# Patient Record
Sex: Female | Born: 1965 | Race: White | Hispanic: No | Marital: Married | Smoking: Never smoker
Health system: Southern US, Community
[De-identification: ages and names within clinical notes are randomized; demographics above are authoritative.]

## PROBLEM LIST (undated history)

## (undated) DIAGNOSIS — E039 Hypothyroidism, unspecified: Secondary | ICD-10-CM

## (undated) DIAGNOSIS — M7580 Other shoulder lesions, unspecified shoulder: Secondary | ICD-10-CM

## (undated) DIAGNOSIS — J302 Other seasonal allergic rhinitis: Secondary | ICD-10-CM

## (undated) DIAGNOSIS — M778 Other enthesopathies, not elsewhere classified: Secondary | ICD-10-CM

## (undated) HISTORY — PX: KNEE ARTHROSCOPY: SHX127

## (undated) HISTORY — DX: Hypothyroidism, unspecified: E03.9

## (undated) HISTORY — PX: APPENDECTOMY: SHX54

## (undated) HISTORY — DX: Other shoulder lesions, unspecified shoulder: M75.80

## (undated) HISTORY — DX: Other enthesopathies, not elsewhere classified: M77.8

---

## 1998-11-20 ENCOUNTER — Inpatient Hospital Stay (HOSPITAL_COMMUNITY): Admission: AD | Admit: 1998-11-20 | Discharge: 1998-11-20 | Payer: Self-pay | Admitting: Gynecology

## 1998-11-20 ENCOUNTER — Other Ambulatory Visit: Admission: RE | Admit: 1998-11-20 | Discharge: 1998-11-20 | Payer: Self-pay | Admitting: Gynecology

## 1999-01-13 ENCOUNTER — Inpatient Hospital Stay (HOSPITAL_COMMUNITY): Admission: AD | Admit: 1999-01-13 | Discharge: 1999-01-13 | Payer: Self-pay | Admitting: Gynecology

## 1999-03-13 ENCOUNTER — Other Ambulatory Visit: Admission: RE | Admit: 1999-03-13 | Discharge: 1999-03-13 | Payer: Self-pay | Admitting: Gynecology

## 1999-12-04 ENCOUNTER — Other Ambulatory Visit: Admission: RE | Admit: 1999-12-04 | Discharge: 1999-12-04 | Payer: Self-pay | Admitting: Gynecology

## 2000-04-30 ENCOUNTER — Encounter: Payer: Self-pay | Admitting: Obstetrics and Gynecology

## 2000-04-30 ENCOUNTER — Ambulatory Visit (HOSPITAL_COMMUNITY): Admission: RE | Admit: 2000-04-30 | Discharge: 2000-04-30 | Payer: Self-pay | Admitting: Obstetrics and Gynecology

## 2000-07-13 ENCOUNTER — Encounter: Payer: Self-pay | Admitting: Obstetrics and Gynecology

## 2000-07-13 ENCOUNTER — Ambulatory Visit (HOSPITAL_COMMUNITY): Admission: RE | Admit: 2000-07-13 | Discharge: 2000-07-13 | Payer: Self-pay | Admitting: Obstetrics and Gynecology

## 2000-08-12 ENCOUNTER — Encounter: Payer: Self-pay | Admitting: Obstetrics and Gynecology

## 2000-08-12 ENCOUNTER — Ambulatory Visit (HOSPITAL_COMMUNITY): Admission: RE | Admit: 2000-08-12 | Discharge: 2000-08-12 | Payer: Self-pay | Admitting: Obstetrics and Gynecology

## 2000-08-14 ENCOUNTER — Encounter: Payer: Self-pay | Admitting: Obstetrics and Gynecology

## 2000-08-14 ENCOUNTER — Encounter (HOSPITAL_COMMUNITY): Admission: RE | Admit: 2000-08-14 | Discharge: 2000-09-12 | Payer: Self-pay | Admitting: Obstetrics and Gynecology

## 2000-08-25 ENCOUNTER — Encounter: Payer: Self-pay | Admitting: Obstetrics and Gynecology

## 2000-09-01 ENCOUNTER — Encounter: Payer: Self-pay | Admitting: Obstetrics and Gynecology

## 2000-09-01 ENCOUNTER — Ambulatory Visit (HOSPITAL_COMMUNITY): Admission: RE | Admit: 2000-09-01 | Discharge: 2000-09-01 | Payer: Self-pay | Admitting: Obstetrics and Gynecology

## 2000-09-08 ENCOUNTER — Encounter: Payer: Self-pay | Admitting: Obstetrics and Gynecology

## 2000-09-12 ENCOUNTER — Inpatient Hospital Stay (HOSPITAL_COMMUNITY): Admission: AD | Admit: 2000-09-12 | Discharge: 2000-09-15 | Payer: Self-pay | Admitting: Obstetrics and Gynecology

## 2000-09-16 ENCOUNTER — Encounter: Admission: RE | Admit: 2000-09-16 | Discharge: 2000-10-16 | Payer: Self-pay | Admitting: Obstetrics and Gynecology

## 2000-10-17 ENCOUNTER — Encounter: Admission: RE | Admit: 2000-10-17 | Discharge: 2000-11-16 | Payer: Self-pay | Admitting: Obstetrics and Gynecology

## 2001-03-15 ENCOUNTER — Other Ambulatory Visit: Admission: RE | Admit: 2001-03-15 | Discharge: 2001-03-15 | Payer: Self-pay | Admitting: Obstetrics and Gynecology

## 2002-04-13 ENCOUNTER — Other Ambulatory Visit: Admission: RE | Admit: 2002-04-13 | Discharge: 2002-04-13 | Payer: Self-pay | Admitting: Obstetrics and Gynecology

## 2003-05-03 ENCOUNTER — Other Ambulatory Visit: Admission: RE | Admit: 2003-05-03 | Discharge: 2003-05-03 | Payer: Self-pay | Admitting: Obstetrics and Gynecology

## 2004-05-06 ENCOUNTER — Other Ambulatory Visit: Admission: RE | Admit: 2004-05-06 | Discharge: 2004-05-06 | Payer: Self-pay | Admitting: Obstetrics and Gynecology

## 2010-10-21 ENCOUNTER — Other Ambulatory Visit: Payer: Self-pay | Admitting: Obstetrics and Gynecology

## 2010-10-21 DIAGNOSIS — Z1231 Encounter for screening mammogram for malignant neoplasm of breast: Secondary | ICD-10-CM

## 2010-10-25 ENCOUNTER — Ambulatory Visit: Payer: Self-pay

## 2010-10-30 ENCOUNTER — Ambulatory Visit: Payer: Self-pay

## 2011-01-07 ENCOUNTER — Ambulatory Visit
Admission: RE | Admit: 2011-01-07 | Discharge: 2011-01-07 | Disposition: A | Discharge status: Home / Self Care | Payer: BC Managed Care – PPO | Source: Ambulatory Visit | Attending: Obstetrics and Gynecology | Admitting: Obstetrics and Gynecology

## 2011-01-07 DIAGNOSIS — Z1231 Encounter for screening mammogram for malignant neoplasm of breast: Secondary | ICD-10-CM

## 2012-02-27 ENCOUNTER — Other Ambulatory Visit: Payer: Self-pay | Admitting: Obstetrics and Gynecology

## 2012-02-27 DIAGNOSIS — Z1231 Encounter for screening mammogram for malignant neoplasm of breast: Secondary | ICD-10-CM

## 2012-03-25 ENCOUNTER — Ambulatory Visit: Payer: BC Managed Care – PPO

## 2012-03-26 ENCOUNTER — Ambulatory Visit
Admission: RE | Admit: 2012-03-26 | Discharge: 2012-03-26 | Disposition: A | Discharge status: Home / Self Care | Payer: BC Managed Care – PPO | Source: Ambulatory Visit | Attending: Obstetrics and Gynecology | Admitting: Obstetrics and Gynecology

## 2012-03-26 DIAGNOSIS — Z1231 Encounter for screening mammogram for malignant neoplasm of breast: Secondary | ICD-10-CM

## 2012-03-29 ENCOUNTER — Other Ambulatory Visit: Payer: Self-pay | Admitting: Obstetrics and Gynecology

## 2012-03-29 DIAGNOSIS — R928 Other abnormal and inconclusive findings on diagnostic imaging of breast: Secondary | ICD-10-CM

## 2012-04-09 ENCOUNTER — Other Ambulatory Visit: Payer: Self-pay | Admitting: Obstetrics and Gynecology

## 2012-04-09 ENCOUNTER — Ambulatory Visit
Admission: RE | Admit: 2012-04-09 | Discharge: 2012-04-09 | Disposition: A | Discharge status: Home / Self Care | Payer: BC Managed Care – PPO | Source: Ambulatory Visit | Attending: Obstetrics and Gynecology | Admitting: Obstetrics and Gynecology

## 2012-04-09 DIAGNOSIS — R928 Other abnormal and inconclusive findings on diagnostic imaging of breast: Secondary | ICD-10-CM

## 2012-04-09 IMAGING — MG MM DIGITAL DIAGNOSTIC LIMITED*L*
1 series · 1 of 1 positions shown · non-contrast
Comparison: Prior studies

CLINICAL DATA: Screening callback for questioned left breast mass

DIGITAL DIAGNOSTIC LEFT MAMMOGRAM WITH CAD

[L LM]
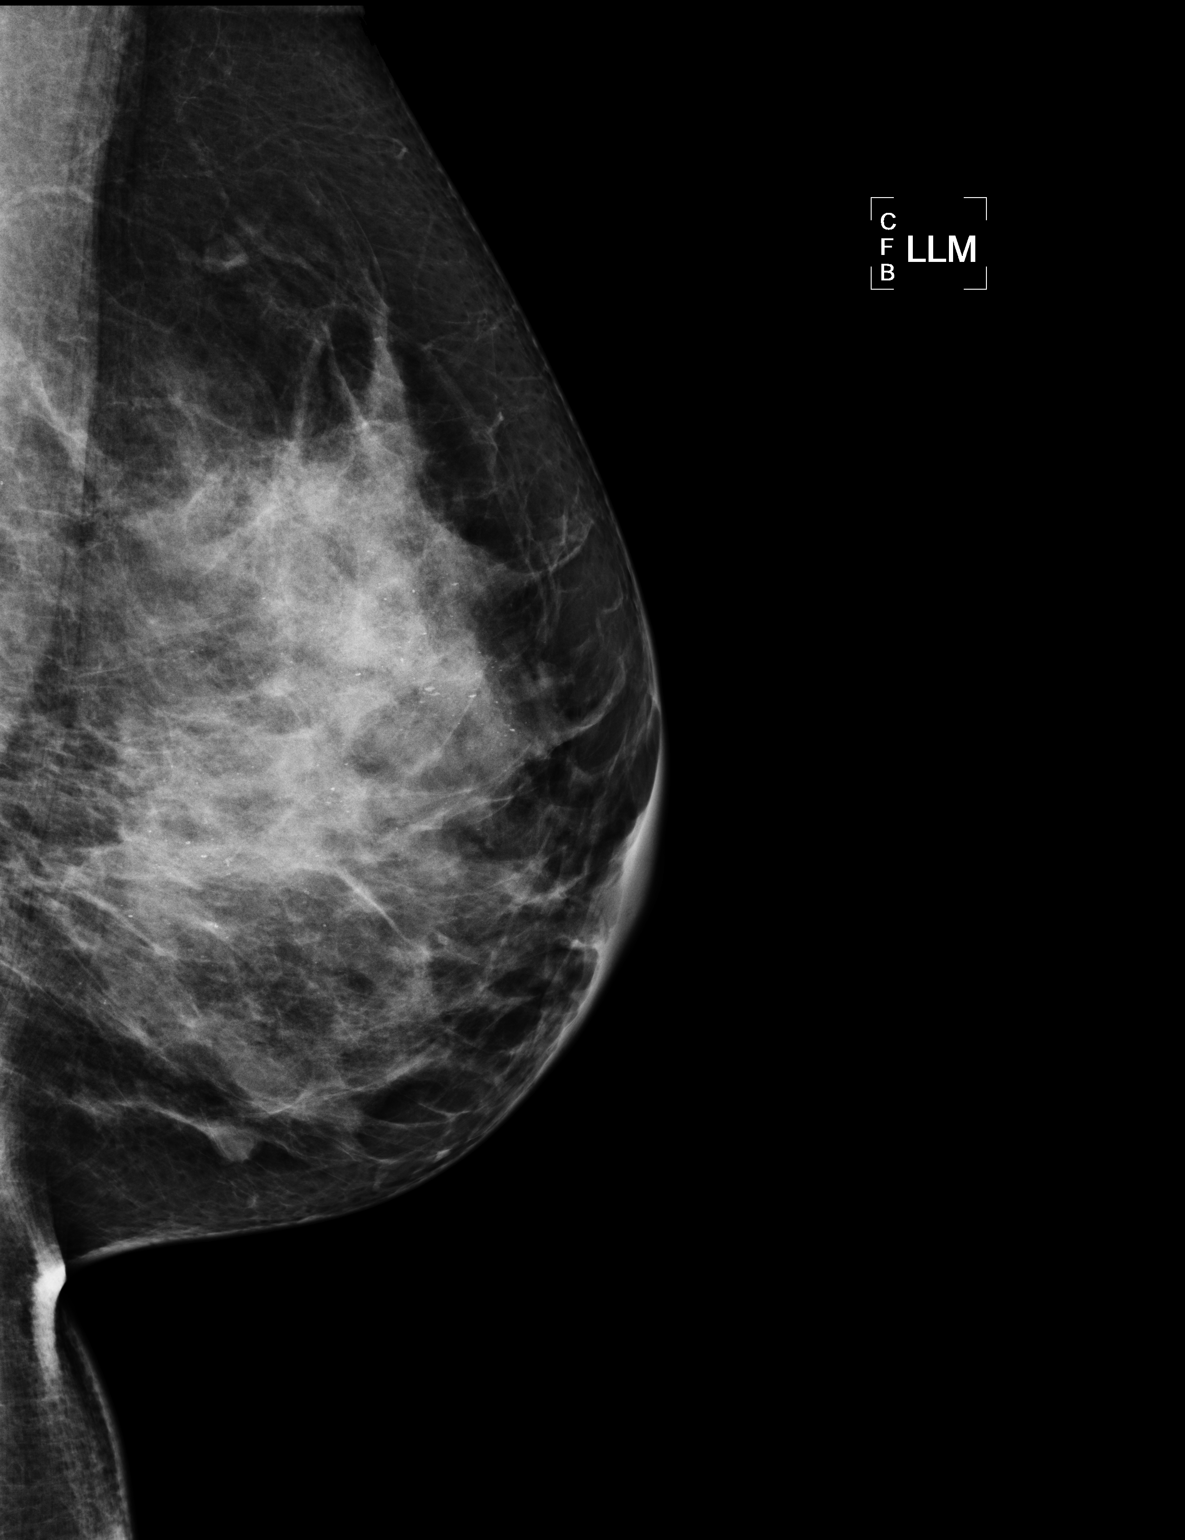

[1 of 1 positions shown; findings below may reference images not displayed]

FINDINGS: The area in question in the left breast is not
reproduced on additional imaging.  The area changes conformation
and demonstrates pliability, compatible with normal-appearing
fibroglandular tissue. No worrisome finding is seen.

ACR breast density category: 4: The breast tissue is extremely
dense.

Mammographic images were processed with CAD.
IMPRESSION: No evidence for malignancy in the left breast. Screening
mammography is recommended in one year. Findings and
recommendations discussed with the patient and provided in written
form at the time of the exam.

BI-RADS CATEGORY 1:  Negative.

## 2013-03-28 ENCOUNTER — Other Ambulatory Visit: Payer: Self-pay

## 2013-03-28 DIAGNOSIS — Z1231 Encounter for screening mammogram for malignant neoplasm of breast: Secondary | ICD-10-CM

## 2013-04-20 ENCOUNTER — Ambulatory Visit: Payer: BC Managed Care – PPO

## 2013-04-22 ENCOUNTER — Ambulatory Visit
Admission: RE | Admit: 2013-04-22 | Discharge: 2013-04-22 | Disposition: A | Discharge status: Home / Self Care | Payer: BC Managed Care – PPO | Source: Ambulatory Visit

## 2013-04-22 DIAGNOSIS — Z1231 Encounter for screening mammogram for malignant neoplasm of breast: Secondary | ICD-10-CM

## 2013-07-20 ENCOUNTER — Ambulatory Visit (INDEPENDENT_AMBULATORY_CARE_PROVIDER_SITE_OTHER): Payer: BC Managed Care – PPO | Admitting: Podiatry

## 2013-07-20 ENCOUNTER — Ambulatory Visit (INDEPENDENT_AMBULATORY_CARE_PROVIDER_SITE_OTHER): Payer: BC Managed Care – PPO

## 2013-07-20 ENCOUNTER — Encounter: Payer: Self-pay | Admitting: Podiatry

## 2013-07-20 VITALS — BP 108/69 | HR 63 | Resp 16 | Ht 64.0 in | Wt 130.0 lb

## 2013-07-20 DIAGNOSIS — M722 Plantar fascial fibromatosis: Secondary | ICD-10-CM

## 2013-07-20 MED ORDER — DICLOFENAC SODIUM 75 MG PO TBEC: 75.0000 mg | DELAYED_RELEASE_TABLET | Freq: Two times a day (BID) | ORAL | Status: DC

## 2013-07-20 MED ORDER — TRIAMCINOLONE ACETONIDE 10 MG/ML IJ SUSP
10.0000 mg | Freq: Once | INTRAMUSCULAR | Status: AC
  Administered 2013-07-20: 10 mg

## 2013-07-20 NOTE — Progress Notes (Signed)
   Subjective:    Patient ID: Leah Stevenson, female    DOB: 09/21/1965, 48 y.o.   MRN: [id number]  HPI Comments: "I have plantar fasciitis, I think"  Patient c/o aching plantar heel and arch left for 2 years. Worsened recently. She does have AM pain. She has tried massaging and Biofreeze-which helps temp.  Foot Pain      Review of Systems  Musculoskeletal: Positive for gait problem.  All other systems reviewed and are negative.      Objective:   Physical Exam        Assessment & Plan:

## 2013-07-20 NOTE — Progress Notes (Signed)
Subjective:     Patient ID: Leah ItoSylvia L Stevenson, female   DOB: 12/01/1965, 48 y.o.   MRN: [id number]  Foot Pain   patient presents with long-term pain in the left plantar heel and admits that it's gotten worse over the last 6 months has not responded to ice therapy and supportive therapy  Review of Systems  All other systems reviewed and are negative.      Objective:   Physical Exam  Nursing note and vitals reviewed. Constitutional: She is oriented to person, place, and time.  Cardiovascular: Intact distal pulses.   Musculoskeletal: Normal range of motion.  Neurological: She is oriented to person, place, and time.  Skin: Skin is warm.   neurovascular status intact with muscle strength found to be adequate in range of motion of the subtalar and midtarsal joint within normal limits. Patient is found to have exquisite discomfort at the left plantar heel insertion of the tendon into the calcaneus and did have good digital perfusion to the toes and I also noted some depression of the arch on weightbearing     Assessment:     Plantar fasciitis of the long-term nature with acute issues left heel with mechanical dysfunction    Plan:     H&P and x-rays reviewed today I injected the plantar fascia 3 mg Kenalog 5 mg Xylocaine Marcaine mixture dispensed fascially brace with instructions for usage and discussed long-term orthotics that we will consider at next visit. Dispensed sheets concerning condition and physical therapy and placed on Voltaren 75 mg twice a day

## 2013-07-20 NOTE — Patient Instructions (Signed)

## 2013-07-28 ENCOUNTER — Ambulatory Visit (INDEPENDENT_AMBULATORY_CARE_PROVIDER_SITE_OTHER): Payer: BC Managed Care – PPO | Admitting: Podiatry

## 2013-07-28 ENCOUNTER — Encounter: Payer: Self-pay | Admitting: Podiatry

## 2013-07-28 VITALS — BP 117/73 | HR 64 | Resp 12 | Ht 64.0 in | Wt 135.0 lb

## 2013-07-28 DIAGNOSIS — M722 Plantar fascial fibromatosis: Secondary | ICD-10-CM

## 2013-07-28 NOTE — Progress Notes (Signed)
Subjective:     Patient ID: Leah Stevenson, female   DOB: Mar 16, 1965, 48 y.o.   MRN: [id number]  HPI patient states my left heel is improving but still sore if I can on it all day   Review of Systems     Objective:   Physical Exam Neurovascular status intact with significant reduction of inflammation and pain left plantar heel    Assessment:     Improved plantar fasciitis left heel    Plan:     Advised on physical therapy supportive shoes in scanned for custom orthotics to reduce all stress against heel and arch

## 2013-08-19 ENCOUNTER — Ambulatory Visit (INDEPENDENT_AMBULATORY_CARE_PROVIDER_SITE_OTHER): Payer: BC Managed Care – PPO | Admitting: *Deleted

## 2013-08-19 DIAGNOSIS — M722 Plantar fascial fibromatosis: Secondary | ICD-10-CM

## 2013-08-19 NOTE — Patient Instructions (Signed)

## 2013-08-19 NOTE — Progress Notes (Signed)
   Subjective:    Patient ID: Leah ItoSylvia L Stief, female    DOB: 10/29/1965, 48 y.o.   MRN: [id number]  HPI PICK UP ORTHOTICS AND GIVEN INSTRUCTION.   Review of Systems     Objective:   Physical Exam        Assessment & Plan:

## 2015-07-07 ENCOUNTER — Encounter (HOSPITAL_COMMUNITY): Payer: Self-pay

## 2015-07-07 ENCOUNTER — Emergency Department (HOSPITAL_COMMUNITY)
Admission: EM | Admit: 2015-07-07 | Discharge: 2015-07-07 | Disposition: A | Discharge status: Home / Self Care | Payer: BLUE CROSS/BLUE SHIELD | Attending: Emergency Medicine | Admitting: Emergency Medicine

## 2015-07-07 ENCOUNTER — Encounter: Payer: Self-pay | Admitting: Cardiovascular Disease

## 2015-07-07 ENCOUNTER — Emergency Department (HOSPITAL_COMMUNITY): Payer: BLUE CROSS/BLUE SHIELD

## 2015-07-07 DIAGNOSIS — Z791 Long term (current) use of non-steroidal anti-inflammatories (NSAID): Secondary | ICD-10-CM | POA: Diagnosis not present

## 2015-07-07 DIAGNOSIS — R008 Other abnormalities of heart beat: Secondary | ICD-10-CM | POA: Diagnosis present

## 2015-07-07 DIAGNOSIS — J159 Unspecified bacterial pneumonia: Secondary | ICD-10-CM | POA: Insufficient documentation

## 2015-07-07 DIAGNOSIS — J189 Pneumonia, unspecified organism: Secondary | ICD-10-CM

## 2015-07-07 DIAGNOSIS — I471 Supraventricular tachycardia: Secondary | ICD-10-CM | POA: Diagnosis not present

## 2015-07-07 HISTORY — DX: Other seasonal allergic rhinitis: J30.2

## 2015-07-07 LAB — BASIC METABOLIC PANEL
Anion gap: 10 (ref 5–15)
BUN: 14 mg/dL (ref 6–20)
CHLORIDE: 108 mmol/L (ref 101–111)
CO2: 22 mmol/L (ref 22–32)
CREATININE: 1.09 mg/dL — AB (ref 0.44–1.00)
Calcium: 8.5 mg/dL — ABNORMAL LOW (ref 8.9–10.3)
GFR calc Af Amer: >60 mL/min (ref >60)
GFR calc non Af Amer: 59 mL/min — ABNORMAL LOW (ref >60)
Glucose, Bld: 117 mg/dL — ABNORMAL HIGH (ref 65–99)
Potassium: 3.8 mmol/L (ref 3.5–5.1)
SODIUM: 140 mmol/L (ref 135–145)

## 2015-07-07 LAB — I-STAT TROPONIN, ED: TROPONIN I, POC: 0.17 ng/mL — AB (ref 0.00–0.08)

## 2015-07-07 LAB — CBC WITH DIFFERENTIAL/PLATELET
BASOS ABS: 0 10*3/uL (ref 0.0–0.1)
BASOS PCT: 0 %
Eosinophils Absolute: 0 10*3/uL (ref 0.0–0.7)
Eosinophils Relative: 0 %
HEMATOCRIT: 39.4 % (ref 36.0–46.0)
HEMOGLOBIN: 13.2 g/dL (ref 12.0–15.0)
LYMPHS PCT: 5 %
Lymphs Abs: 1 10*3/uL (ref 0.7–4.0)
MCH: 30.8 pg (ref 26.0–34.0)
MCHC: 33.5 g/dL (ref 30.0–36.0)
MCV: 91.8 fL (ref 78.0–100.0)
MONO ABS: 1 10*3/uL (ref 0.1–1.0)
Monocytes Relative: 5 %
NEUTROS ABS: 20.2 10*3/uL — AB (ref 1.7–7.7)
NEUTROS PCT: 90 %
Platelets: 211 10*3/uL (ref 150–400)
RBC: 4.29 MIL/uL (ref 3.87–5.11)
RDW: 12.2 % (ref 11.5–15.5)
WBC: 22.2 10*3/uL — AB (ref 4.0–10.5)

## 2015-07-07 MED ORDER — AMOXICILLIN-POT CLAVULANATE 875-125 MG PO TABS: 1.0000 | ORAL_TABLET | Freq: Two times a day (BID) | ORAL | Status: DC

## 2015-07-07 MED ORDER — DEXTROSE 5 % IV SOLN
1.0000 g | Freq: Once | INTRAVENOUS | Status: AC
  Administered 2015-07-07: 1 g via INTRAVENOUS
  Filled 2015-07-07: qty 10

## 2015-07-07 MED ORDER — ASPIRIN 81 MG PO CHEW
324.0000 mg | CHEWABLE_TABLET | Freq: Once | ORAL | Status: DC
  Filled 2015-07-07: qty 4

## 2015-07-07 MED ORDER — DEXTROSE 5 % IV SOLN
500.0000 mg | Freq: Once | INTRAVENOUS | Status: AC
  Administered 2015-07-07: 500 mg via INTRAVENOUS
  Filled 2015-07-07: qty 500

## 2015-07-07 NOTE — ED Provider Notes (Signed)
CSN: [id number]     Arrival date & time 07/07/15  1728 History   First MD Initiated Contact with Patient 07/07/15 1743     Chief Complaint  Patient presents with  . Irregular Heart Beat     (Consider location/radiation/quality/duration/timing/severity/associated sxs/prior Treatment) HPI Comments: Patient presents to the emergency department with chief complaint of SVT. She states that she woke this morning and felt dizzy and had a headache. She states that she went to the [facility], and was found to have a heart rate in the 220s. EMS was called. The patient was given 6 mg of adenosine route to the hospital. He converted to normal sinus rhythm. She states that she does not have any chest pain or shortness breath. She complains only of mild headache. She rates this as a 4 out of 10. She denies any other symptoms. She denies any other medical problems. She states that she took some Sudafed this morning, and takes Zyrtec daily.  The history is provided by the patient. No language interpreter was used.    Past Medical History  Diagnosis Date  . Seasonal allergies    Past Surgical History  Procedure Laterality Date  . Appendectomy     No family history on file. Social History  Substance Use Topics  . Smoking status: Never Smoker   . Smokeless tobacco: None  . Alcohol Use: No   OB History    No data available     Review of Systems  Constitutional: Negative for fever and chills.  Respiratory: Negative for shortness of breath.   Cardiovascular: Negative for chest pain.  Gastrointestinal: Negative for nausea, vomiting, diarrhea and constipation.  Genitourinary: Negative for dysuria.  Neurological: Positive for light-headedness and headaches.  All other systems reviewed and are negative.     Allergies  Review of patient's allergies indicates no known allergies.  Home Medications   Prior to Admission medications   Medication Sig Start Date End Date Taking? Authorizing  Provider  diclofenac (VOLTAREN) 75 MG EC tablet Take 1 tablet (75 mg total) by mouth 2 (two) times daily. 07/20/13   Lenn SinkNorman S Regal, DPM   BP 122/85 mmHg  Pulse 100  Temp(Src) 98.4 F (36.9 C) (Oral)  Resp 26  SpO2 99%  LMP 06/17/2015 Physical Exam  Constitutional: She is oriented to person, place, and time. She appears well-developed and well-nourished.  HENT:  Head: Normocephalic and atraumatic.  Eyes: Conjunctivae and EOM are normal. Pupils are equal, round, and reactive to light.  Neck: Normal range of motion. Neck supple.  Cardiovascular: Normal rate and regular rhythm.  Exam reveals no gallop and no friction rub.   No murmur heard. Pulmonary/Chest: Effort normal and breath sounds normal. No respiratory distress. She has no wheezes. She has no rales. She exhibits no tenderness.  Abdominal: Soft. Bowel sounds are normal. She exhibits no distension and no mass. There is no tenderness. There is no rebound and no guarding.  Musculoskeletal: Normal range of motion. She exhibits no edema or tenderness.  Neurological: She is alert and oriented to person, place, and time.  Skin: Skin is warm and dry.  Psychiatric: She has a normal mood and affect. Her behavior is normal. Judgment and thought content normal.  Nursing note and vitals reviewed.   ED Course  Procedures (including critical care time) Results for orders placed or performed during the hospital encounter of 07/07/15  CBC with Differential/Platelet  Result Value Ref Range   WBC 22.2 (H) 4.0 - 10.5 K/uL  RBC 4.29 3.87 - 5.11 MIL/uL   Hemoglobin 13.2 12.0 - 15.0 g/dL   HCT 16.1 09.6 - 04.5 %   MCV 91.8 78.0 - 100.0 fL   MCH 30.8 26.0 - 34.0 pg   MCHC 33.5 30.0 - 36.0 g/dL   RDW 40.9 81.1 - 91.4 %   Platelets 211 150 - 400 K/uL   Neutrophils Relative % 90 %   Neutro Abs 20.2 (H) 1.7 - 7.7 K/uL   Lymphocytes Relative 5 %   Lymphs Abs 1.0 0.7 - 4.0 K/uL   Monocytes Relative 5 %   Monocytes Absolute 1.0 0.1 - 1.0 K/uL    Eosinophils Relative 0 %   Eosinophils Absolute 0.0 0.0 - 0.7 K/uL   Basophils Relative 0 %   Basophils Absolute 0.0 0.0 - 0.1 K/uL  Basic metabolic panel  Result Value Ref Range   Sodium 140 135 - 145 mmol/L   Potassium 3.8 3.5 - 5.1 mmol/L   Chloride 108 101 - 111 mmol/L   CO2 22 22 - 32 mmol/L   Glucose, Bld 117 (H) 65 - 99 mg/dL   BUN 14 6 - 20 mg/dL   Creatinine, Ser 7.82 (H) 0.44 - 1.00 mg/dL   Calcium 8.5 (L) 8.9 - 10.3 mg/dL   GFR calc non Af Amer 59 (L) >60 mL/min   GFR calc Af Amer >60 >60 mL/min   Anion gap 10 5 - 15  I-stat troponin, ED  Result Value Ref Range   Troponin i, poc 0.17 (HH) 0.00 - 0.08 ng/mL   Comment NOTIFIED PHYSICIAN    Comment 3           Dg Chest 2 View  07/07/2015  CLINICAL DATA:  Lightheadedness.  Congestion for 3 weeks. EXAM: CHEST  2 VIEW COMPARISON:  None. FINDINGS: Mild opacity in the right middle lobe is identified on the frontal and lateral views. The heart, hila, mediastinum, and remainder of the lungs are normal. IMPRESSION: Infiltrate in the right middle lobe. Recommend follow-up to resolution. Electronically Signed   By: Gerome Sam III M.D   On: 07/07/2015 18:41    I have personally reviewed and evaluated these images and lab results as part of my medical decision-making.   EKG Interpretation   Date/Time:  Saturday Jul 07 2015 17:37:07 EDT Ventricular Rate:  91 PR Interval:  152 QRS Duration: 87 QT Interval:  346 QTC Calculation: 426 R Axis:   84 Text Interpretation:  Sinus rhythm No previous tracing Confirmed by Denton Lank   MD, Caryn Bee ([id number]) on 07/07/2015 6:01:26 PM      MDM   Final diagnoses:  SVT (supraventricular tachycardia) (HCC)  CAP (community acquired pneumonia)    Patient brought to the ED by EMS for SVT. Heart rate was in the 220s. She was given 6 mg of adenosine with resolution. She is now in normal sinus rhythm, and his rate control. She took Sudafed this morning, this could have been the cause. She denies any  chest pain or shortness of breath.  Laboratory workup remarkable for moderate leukocytosis to 22, chest x-ray is remarkable for right-sided lobar pneumonia. Will treat with Rocephin and azithromycin in the ED. Patient has also had sinus congestion, and believes she has a sinus infection, will treat with Augmentin on an outpatient basis. Troponin is mildly elevated at 0.17.  This was discussed with Dr. Denton Lank, who states that this doesn't preclude discharge.  She has no chest pain or SOB.  She has no ischemic EKG  changes.    Patient seen by and discussed with Dr. Denton Lank.  She feels well now.  She states that she would prefer to go home.  Will have her follow-up with cardiology.  Strict return precautions given.    Roxy Horseman, PA-C 07/07/15 2022  Cathren Laine, MD 07/08/15 [phone]

## 2015-07-07 NOTE — Discharge Instructions (Signed)
Paroxysmal Supraventricular Tachycardia  Paroxysmal supraventricular tachycardia (PSVT) is a type of abnormal heart rhythm. It causes your heart to beat very quickly and then suddenly stop beating so quickly. A normal heart rate is 60-100 beats per minute. During an episode of PSVT, your heart rate may be 150-250 beats per minute. This can make you feel light-headed and short of breath. An episode of PSVT can be frightening. It is usually not dangerous.  The heart has four chambers. All chambers need to work together for the heart to beat effectively. A normal heartbeat usually starts in the right upper chamber of the heart (atrium) when an area (sinoatrial node) puts out an electrical signal that spreads to the other chambers. People with PSVT may have abnormal electrical pathways, or they may have other areas in the upper chambers that send out electrical signals. The result is a very rapid heartbeat.  When your heart beats very quickly, it does not have time to fill completely with blood. When PSVT happens often or it lasts for long periods, it can lead to heart weakness and failure. Most people with PSVT do not have any other heart disease.  CAUSES  Abnormal electrical activity in the heart causes PSVT. It is not known why some people get PSVT and others do not.  RISK FACTORS  You may be more likely to have PSVT if:   You are 20-30 years old.   You are a woman.  Other factors that may increase your chances of an attack include:   Stress.   Being tired.   Smoking.   Stimulant drugs.   Alcoholic drinks.   Caffeine.   Pregnancy.  SIGNS AND SYMPTOMS  A mild episode of PSVT may cause no symptoms. If you do have signs and symptoms, they may include:   A pounding heart.   Feeling of skipped heartbeats (palpitations).   Weakness.   Shortness of breath.   Tightness or pain in your chest.   Light-headedness.   Anxiety.   Dizziness.   Sweating.   Nausea.   A fainting spell.  DIAGNOSIS  Your health care  provider may suspect PSVT if you have symptoms that come and go. The health care provider will do a physical exam. If you are having an episode during the exam, the health care provider may be able to diagnose PSVT by listening to your heart and feeling your pulse. Tests may also be done, including:   An electrical study of your heart (electrocardiogram, or ECG).   A test in which you wear a portable ECG monitor all day (Holter monitor) or for several days (event monitor).   A test that involves taking an image of your heart using sound waves (echocardiogram) to rule out other causes of a fast heart rate.  TREATMENT  You may not need treatment if episodes of PSVT do not happen often or if they do not cause symptoms. If PSVT episodes do cause symptoms, your health care provider may first suggest trying a self-treatment called vagus nerve stimulation. The vagus nerve extends down from the brain. It regulates certain body functions. Stimulating this nerve can slow down the heart. Your health care provider can teach you ways to do this. You may need to try a few ways to find what works best for you. Options include:   Holding your breath and pushing, as though you are having a bowel movement.   Massaging an area on one side of your neck below your jaw.     Medicines to prevent an attack.  Being treated in the hospital with medicine or electric shock to stop an attack (cardioversion). This treatment can include:  Getting medicine through an IV line.  Having a small electric shock delivered to your heart. You will be given medicine to make you sleep through this procedure.  If you have frequent episodes with symptoms, you may need a procedure to get rid of the faulty  areas of your heart (radiofrequency ablation) and end the episodes of PSVT. In this procedure:  A long, thin tube (catheter) is passed through one of your veins into your heart.  Energy directed through the catheter eliminates the areas of your heart that are causing abnormal electric stimulation. HOME CARE INSTRUCTIONS  Take medicines only as directed by your health care provider.  Do not use caffeine in any form if caffeine triggers episodes of PSVT. Otherwise, consume caffeine in moderation. This means no more than a few cups of coffee or the equivalent each day.  Do not drink alcohol if alcohol triggers episodes of PSVT. Otherwise, limit alcohol intake to no more than 1 drink per day for nonpregnant women and 2 drinks per day for men. One drink equals 12 ounces of beer, 5 ounces of wine, or 1 ounces of hard liquor.  Do not use any tobacco products, including cigarettes, chewing tobacco, or electronic cigarettes. If you need help quitting, ask your health care provider.  Try to get at least 7 hours of sleep each night.  Find healthy ways to manage stress.  Perform vagus nerve stimulation as directed by your health care provider.  Maintain a healthy weight.  Get some exercise on most days. Ask your health care provider to suggest some good activities for you. SEEK MEDICAL CARE IF:  You are having episodes of PSVT more often, or they are lasting longer.  Vagus nerve stimulation is no longer helping.  You have new symptoms during an episode. SEEK IMMEDIATE MEDICAL CARE IF:  You have chest pain or trouble breathing.  You have an episode of PSVT that has lasted longer than 20 minutes.  You have passed out from an episode of PSVT. These symptoms may represent a serious problem that is an emergency. Do not wait to see if the symptoms will go away. Get medical help right away. Call your local emergency services (911 in the [place] Do not drive yourself to the hospital.   This  information is not intended to replace advice given to you by your health care provider. Make sure you discuss any questions you have with your health care provider.   Document Released: 02/17/2005 Document Revised: 03/10/2014 Document Reviewed: 07/28/2013 Elsevier Interactive Patient Education 2016 [employer]  Community-Acquired Pneumonia, Adult Pneumonia is an infection of the lungs. There are different types of pneumonia. One type can develop while a person is in a hospital. A different type, called community-acquired pneumonia, develops in people who are not, or have not recently been, in the hospital or other health care facility.  CAUSES Pneumonia may be caused by bacteria, viruses, or funguses. Community-acquired pneumonia is often caused by Streptococcus pneumonia bacteria. These bacteria are often passed from one person to another by breathing in droplets from the cough or sneeze of an infected person. RISK FACTORS The condition is more likely to develop in:  People who havechronic diseases, such as chronic obstructive pulmonary disease (COPD), asthma, congestive heart failure, cystic fibrosis, diabetes, or kidney disease.  People who haveearly-stage or late-stage HIV.  People who havesickle cell disease.  People who havehad their spleen removed (splenectomy).  People who havepoor Administratordental hygiene.  People who havemedical conditions that increase the risk of breathing in (aspirating) secretions their own mouth and nose.   People who havea weakened immune system (immunocompromised).  People who smoke.  People whotravel to areas where pneumonia-causing germs commonly exist.  People whoare around animal habitats or animals that have pneumonia-causing germs, including birds, bats, rabbits, cats, and farm animals. SYMPTOMS Symptoms of this condition include:  Adry cough.  A wet (productive) cough.  Fever.  Sweating.  Chest pain, especially when breathing  deeply or coughing.  Rapid breathing or difficulty breathing.  Shortness of breath.  Shaking chills.  Fatigue.  Muscle aches. DIAGNOSIS Your health care provider will take a medical history and perform a physical exam. You may also have other tests, including:  Imaging studies of your chest, including X-rays.  Tests to check your blood oxygen level and other blood gases.  Other tests on blood, mucus (sputum), fluid around your lungs (pleural fluid), and urine. If your pneumonia is severe, other tests may be done to identify the specific cause of your illness. TREATMENT The type of treatment that you receive depends on many factors, such as the cause of your pneumonia, the medicines you take, and other medical conditions that you have. For most adults, treatment and recovery from pneumonia may occur at home. In some cases, treatment must happen in a hospital. Treatment may include:  Antibiotic medicines, if the pneumonia was caused by bacteria.  Antiviral medicines, if the pneumonia was caused by a virus.  Medicines that are given by mouth or through an IV tube.  Oxygen.  Respiratory therapy. Although rare, treating severe pneumonia may include:  Mechanical ventilation. This is done if you are not breathing well on your own and you cannot maintain a safe blood oxygen level.  Thoracentesis. This procedureremoves fluid around one lung or both lungs to help you breathe better. HOME CARE INSTRUCTIONS  Take over-the-counter and prescription medicines only as told by your health care provider.  Only takecough medicine if you are losing sleep. Understand that cough medicine can prevent your body's natural ability to remove mucus from your lungs.  If you were prescribed an antibiotic medicine, take it as told by your health care provider. Do not stop taking the antibiotic even if you start to feel better.  Sleep in a semi-upright position at night. Try sleeping in a reclining  chair, or place a few pillows under your head.  Do not use tobacco products, including cigarettes, chewing tobacco, and e-cigarettes. If you need help quitting, ask your health care provider.  Drink enough water to keep your urine clear or pale yellow. This will help to thin out mucus secretions in your lungs. PREVENTION There are ways that you can decrease your risk of developing community-acquired pneumonia. Consider getting a pneumococcal vaccine if:  You are older than 49 years of age.  You are older than 49 years of age and are undergoing cancer treatment, have chronic lung disease, or have other medical conditions that affect your immune system. Ask your health care provider if this applies to you. There are different types and schedules of pneumococcal vaccines. Ask your health care provider which vaccination option is best for you. You may also prevent community-acquired pneumonia if you take these actions:  Get an influenza vaccine every year. Ask your health care provider which type of influenza vaccine is best for  you.  Go to the dentist on a regular basis.  Wash your hands often. Use hand sanitizer if soap and water are not available. SEEK MEDICAL CARE IF:  You have a fever.  You are losing sleep because you cannot control your cough with cough medicine. SEEK IMMEDIATE MEDICAL CARE IF:  You have worsening shortness of breath.  You have increased chest pain.  Your sickness becomes worse, especially if you are an older adult or have a weakened immune system.  You cough up blood.   This information is not intended to replace advice given to you by your health care provider. Make sure you discuss any questions you have with your health care provider.   Document Released: 02/17/2005 Document Revised: 11/08/2014 Document Reviewed: 06/14/2014 Elsevier Interactive Patient Education [employer]

## 2015-07-07 NOTE — ED Notes (Signed)
[facility] to being dizzy having a headache and not feeling well. Pt. Thought she had a sinus infection and took Sudafed 2 tablets.  When she was there pt. Had a heart rate 210-220.  They called EMS and she was given Adenosine 6 mg IV which converted pt. To NSR 97,  Pt . Denies any chest pain or sob.  Skin is pink,warm and dry.  Pt. Does not have a hx of SVT.  Continues to have a headache.  4/10,

## 2015-07-09 ENCOUNTER — Telehealth (HOSPITAL_BASED_OUTPATIENT_CLINIC_OR_DEPARTMENT_OTHER): Payer: Self-pay | Admitting: Emergency Medicine

## 2015-08-01 ENCOUNTER — Encounter: Payer: Self-pay | Admitting: Cardiovascular Disease

## 2015-08-02 ENCOUNTER — Encounter: Payer: Self-pay | Admitting: Cardiovascular Disease

## 2015-08-03 ENCOUNTER — Ambulatory Visit (INDEPENDENT_AMBULATORY_CARE_PROVIDER_SITE_OTHER): Payer: BLUE CROSS/BLUE SHIELD | Admitting: Cardiovascular Disease

## 2015-08-03 ENCOUNTER — Encounter: Payer: Self-pay | Admitting: Cardiovascular Disease

## 2015-08-03 VITALS — BP 140/72 | HR 68 | Ht 64.0 in | Wt 142.1 lb

## 2015-08-03 DIAGNOSIS — Z7189 Other specified counseling: Secondary | ICD-10-CM

## 2015-08-03 DIAGNOSIS — I471 Supraventricular tachycardia: Secondary | ICD-10-CM

## 2015-08-03 DIAGNOSIS — Z7689 Persons encountering health services in other specified circumstances: Secondary | ICD-10-CM

## 2015-08-03 NOTE — Progress Notes (Signed)
Patient ID: Leah Stevenson, female   DOB: 17-Feb-1966, 50 y.o.   MRN: [id number]     Cardiology Office Note   Date:  08/03/2015   ID:  Leah Stevenson, DOB 1965-11-26, MRN [id number]  PCP:   Duane Lope, MD  Cardiologist:   Charlton Haws, MD   No chief complaint on file.     History of Present Illness: Leah Stevenson is a 50 y.o. female who presents for evaluation of SVT Seen in ER 07/07/15  . She states that she woke that morning and felt dizzy and had a headache. She states that she went to the [facility], and was found to have a heart rate in the 220s. EMS was called. The patient was given 6 mg of adenosine route to the hospital. He converted to normal sinus rhythm. She states that she does not have any chest pain or shortness breath. She complains only of mild headache. She rates this as a 4 out of 10. She denies any other symptoms. She denies any other medical problems. She states that she took some Sudafed  takes Zyrtec daily. Diagnosed with pneumonia after SVT Rx and had course of antibiotics  She has no previous cardiac issues Has a brother with SVT in [place].  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    Denies excess ETOH , Drugs or other stimulants      Past Medical History  Diagnosis Date  . Seasonal allergies   . Shoulder tendonitis     right  . Hypothyroidism     Past Surgical History  Procedure Laterality Date  . Appendectomy    . Knee arthroscopy Left      Current Outpatient Prescriptions  Medication Sig Dispense Refill  . amoxicillin-clavulanate (AUGMENTIN) 875-125 MG tablet Take 1 tablet by mouth every 12 (twelve) hours. 14 tablet 0  . cetirizine (ZYRTEC) 10 MG tablet Take 10 mg by mouth daily.    . diclofenac (VOLTAREN) 75 MG EC tablet Take 1 tablet (75 mg total) by mouth 2 (two) times daily. 50 tablet 2  . Multiple Vitamin (MULTIVITAMIN WITH  MINERALS) TABS tablet Take 1 tablet by mouth daily.    . pseudoephedrine (SUDAFED) 120 MG 12 hr tablet Take 120 mg by mouth every 12 (twelve) hours as needed for congestion.     No current facility-administered medications for this visit.    Allergies:   Review of patient's allergies indicates no known allergies.    Social History:  The patient  reports that she has never smoked. She does not have any smokeless tobacco history on file. She reports that she drinks alcohol. She reports that she does not use illicit drugs.   Family History:  The patient's family history includes Breast cancer in her sister; Cancer in her father; Parkinson's disease in her brother.    ROS:  Please see the history of present illness.   Otherwise, review of systems are positive for none.   All other systems are reviewed and negative.    PHYSICAL EXAM: VS:  LMP 06/16/2015 , BMI There is no weight on file to calculate BMI. Affect appropriate Healthy:  appears stated age HEENT: normal Neck supple with no adenopathy JVP normal no bruits no thyromegaly Lungs clear with no wheezing and good diaphragmatic motion Heart:  S1/S2 no murmur, no rub, gallop or click PMI normal  Abdomen: benighn, BS positve, no tenderness, no AAA no bruit.  No HSM or HJR Distal pulses intact with no bruits No edema Neuro non-focal Skin warm and dry No muscular weakness    EKG:  SR rate 91 normal no pre excitation    Recent Labs: 07/07/2015: BUN 14; Creatinine, Ser 1.09*; Hemoglobin 13.2; Platelets 211; Potassium 3.8; Sodium 140    Lipid Panel No results found for: CHOL, TRIG, HDL, CHOLHDL, VLDL, LDLCALC, LDLDIRECT    Wt Readings from Last 3 Encounters:  07/28/13 61.236 kg (135 lb)  07/20/13 58.968 kg (130 lb)      Other studies Reviewed: Additional studies/ records that were reviewed today include: EMS notes and strips pre/post adenosine scanned into epic.    ASSESSMENT AND PLAN:  1.  SVT:  Benign Discussed  vagal maneuvers Echo to r/o structural heart disease.  Stop zyrtec which has been associated with SVT  Did indicate possible referral to EP if she has more episodes in future  2. Allergies avoid pseudofed and zytec.  Consider claritin or allegra. 3. Pneumonia ppt of SVT clear lung exam now post antibiotics improved    Current medicines are reviewed at length with the patient today.  The patient does not have concerns regarding medicines.  The following changes have been made:  no change  Labs/ tests ordered today include: Echo  No orders of the defined types were placed in this encounter.     Disposition:   FU with me in 6 months      Signed, Charlton HawsPeter Narada Uzzle, MD  08/03/2015 8:23 AM    [facility] [address], [place], [place]  [address] Phone: [phone]; Fax: [phone]

## 2015-08-03 NOTE — Patient Instructions (Signed)

## 2015-08-23 ENCOUNTER — Other Ambulatory Visit: Payer: Self-pay

## 2015-08-23 ENCOUNTER — Ambulatory Visit (HOSPITAL_COMMUNITY): Payer: BLUE CROSS/BLUE SHIELD | Attending: Cardiology

## 2015-08-23 DIAGNOSIS — Z7189 Other specified counseling: Secondary | ICD-10-CM | POA: Insufficient documentation

## 2015-08-23 DIAGNOSIS — Z7689 Persons encountering health services in other specified circumstances: Secondary | ICD-10-CM

## 2015-08-23 DIAGNOSIS — E039 Hypothyroidism, unspecified: Secondary | ICD-10-CM | POA: Insufficient documentation

## 2015-08-23 DIAGNOSIS — I340 Nonrheumatic mitral (valve) insufficiency: Secondary | ICD-10-CM | POA: Diagnosis not present

## 2015-08-23 DIAGNOSIS — I471 Supraventricular tachycardia: Secondary | ICD-10-CM | POA: Insufficient documentation

## 2015-08-23 LAB — ECHOCARDIOGRAM COMPLETE
AVLVOTPG: 5 mmHg
CHL CUP MV DEC (S): 176
CHL CUP TV REG PEAK VELOCITY: 201 cm/s
E decel time: 176 msec
E/e' ratio: 8.13
FS: 36 % (ref 28–44)
IVS/LV PW RATIO, ED: 0.98
LA ID, A-P, ES: 32 mm
LA diam index: 1.89 cm/m2
LA vol: 38 mL
LAVOLA4C: 41 mL
LAVOLIN: 22.5 mL/m2
LDCA: 2.27 cm2
LEFT ATRIUM END SYS DIAM: 32 mm
LV E/e'average: 8.13
LV PW d: 6.38 mm — AB (ref 0.6–1.1)
LVEEMED: 8.13
LVELAT: 13.4 cm/s
LVOT SV: 58 mL
LVOT VTI: 25.5 cm
LVOT peak vel: 117 cm/s
LVOTD: 17 mm
MV pk E vel: 109 m/s
MVPG: 5 mmHg
MVPKAVEL: 69.1 m/s
TDI e' lateral: 13.4
TDI e' medial: 8.77
TRMAXVEL: 201 cm/s

## 2015-08-24 ENCOUNTER — Telehealth: Payer: Self-pay | Admitting: Cardiovascular Disease

## 2015-08-24 NOTE — Telephone Encounter (Signed)
Patient aware of results. Per Dr. Eden EmmsNishan, Normal echo with good EF and no significant valvular heart disease. Patient verbalized understanding.

## 2015-08-24 NOTE — Telephone Encounter (Signed)
F/u Message ° °Pt returning RN call about echo results. Please call back to discuss  °

## 2015-10-24 ENCOUNTER — Other Ambulatory Visit: Payer: Self-pay | Admitting: Obstetrics and Gynecology

## 2015-10-24 DIAGNOSIS — R928 Other abnormal and inconclusive findings on diagnostic imaging of breast: Secondary | ICD-10-CM

## 2015-10-29 ENCOUNTER — Ambulatory Visit
Admission: RE | Admit: 2015-10-29 | Discharge: 2015-10-29 | Disposition: A | Discharge status: Home / Self Care | Payer: BLUE CROSS/BLUE SHIELD | Source: Ambulatory Visit | Attending: Obstetrics and Gynecology | Admitting: Obstetrics and Gynecology

## 2015-10-29 DIAGNOSIS — R928 Other abnormal and inconclusive findings on diagnostic imaging of breast: Secondary | ICD-10-CM

## 2017-11-26 ENCOUNTER — Other Ambulatory Visit: Payer: Self-pay | Admitting: Obstetrics and Gynecology

## 2017-11-26 DIAGNOSIS — N6452 Nipple discharge: Secondary | ICD-10-CM

## 2017-12-02 ENCOUNTER — Ambulatory Visit
Admission: RE | Admit: 2017-12-02 | Discharge: 2017-12-02 | Disposition: A | Discharge status: Home / Self Care | Payer: BLUE CROSS/BLUE SHIELD | Source: Ambulatory Visit | Attending: Obstetrics and Gynecology | Admitting: Obstetrics and Gynecology

## 2017-12-02 ENCOUNTER — Ambulatory Visit
Admission: RE | Admit: 2017-12-02 | Discharge: 2017-12-02 | Disposition: A | Discharge status: Home / Self Care | Payer: PRIVATE HEALTH INSURANCE | Source: Ambulatory Visit | Attending: Obstetrics and Gynecology | Admitting: Obstetrics and Gynecology

## 2017-12-02 DIAGNOSIS — N6452 Nipple discharge: Secondary | ICD-10-CM

## 2023-03-26 DIAGNOSIS — R350 Frequency of micturition: Secondary | ICD-10-CM | POA: Diagnosis not present

## 2023-03-26 DIAGNOSIS — N390 Urinary tract infection, site not specified: Secondary | ICD-10-CM | POA: Diagnosis not present

## 2023-06-16 DIAGNOSIS — K5730 Diverticulosis of large intestine without perforation or abscess without bleeding: Secondary | ICD-10-CM | POA: Diagnosis not present

## 2023-06-16 DIAGNOSIS — Z1211 Encounter for screening for malignant neoplasm of colon: Secondary | ICD-10-CM | POA: Diagnosis not present
